# Patient Record
Sex: Male | Born: 1990 | Race: White | Hispanic: No | Marital: Married | Smoking: Current every day smoker
Health system: Southern US, Community
[De-identification: ages and names within clinical notes are randomized; demographics above are authoritative.]

## PROBLEM LIST (undated history)

## (undated) DIAGNOSIS — I10 Essential (primary) hypertension: Secondary | ICD-10-CM

## (undated) HISTORY — PX: VASECTOMY: SHX75

## (undated) HISTORY — PX: LEG SURGERY: SHX1003

## (undated) HISTORY — DX: Essential (primary) hypertension: I10

---

## 2010-02-05 ENCOUNTER — Emergency Department: Payer: Self-pay | Admitting: Unknown Physician Specialty

## 2010-04-29 ENCOUNTER — Emergency Department: Payer: Self-pay | Admitting: Emergency Medicine

## 2010-06-16 ENCOUNTER — Emergency Department: Payer: Self-pay | Admitting: Emergency Medicine

## 2013-04-24 ENCOUNTER — Ambulatory Visit: Payer: Self-pay | Admitting: Internal Medicine

## 2013-04-25 ENCOUNTER — Ambulatory Visit: Payer: Self-pay | Admitting: Oncology

## 2013-04-26 ENCOUNTER — Ambulatory Visit: Payer: Self-pay | Admitting: Oncology

## 2013-04-26 LAB — COMPREHENSIVE METABOLIC PANEL
Albumin: 4.2 g/dL (ref 3.4–5.0)
Alkaline Phosphatase: 97 U/L (ref 50–136)
Anion Gap: 8 (ref 7–16)
Chloride: 101 mmol/L (ref 98–107)
Creatinine: 1 mg/dL (ref 0.60–1.30)
EGFR (African American): >60
Glucose: 96 mg/dL (ref 65–99)
Osmolality: 281 (ref 275–301)
Potassium: 4.4 mmol/L (ref 3.5–5.1)
SGPT (ALT): 33 U/L (ref 12–78)
Sodium: 140 mmol/L (ref 136–145)
Total Protein: 7.7 g/dL (ref 6.4–8.2)

## 2013-04-26 LAB — CBC CANCER CENTER
Basophil #: 0.1 x10 3/mm (ref 0.0–0.1)
HCT: 47.8 % (ref 40.0–52.0)
Lymphocyte #: 2 x10 3/mm (ref 1.0–3.6)
Lymphocyte %: 16.8 %
MCV: 85 fL (ref 80–100)
Monocyte #: 0.9 x10 3/mm (ref 0.2–1.0)
Neutrophil %: 71.3 %
Platelet: 280 x10 3/mm (ref 150–440)
RBC: 5.63 10*6/uL (ref 4.40–5.90)
WBC: 11.9 x10 3/mm — ABNORMAL HIGH (ref 3.8–10.6)

## 2013-04-26 LAB — LACTATE DEHYDROGENASE: LDH: 173 U/L (ref 85–241)

## 2013-05-09 ENCOUNTER — Encounter: Payer: Self-pay | Admitting: General Surgery

## 2013-05-09 ENCOUNTER — Ambulatory Visit (INDEPENDENT_AMBULATORY_CARE_PROVIDER_SITE_OTHER): Payer: No Typology Code available for payment source | Admitting: General Surgery

## 2013-05-09 VITALS — BP 140/80 | HR 78 | Resp 12 | Ht 75.0 in | Wt 237.0 lb

## 2013-05-09 DIAGNOSIS — I889 Nonspecific lymphadenitis, unspecified: Secondary | ICD-10-CM

## 2013-05-09 NOTE — Progress Notes (Signed)
Patient ID: DOMNIQUE VANTINE, male   DOB: 1991/02/20, 22 y.o.   MRN: [id number]  Chief Complaint  Patient presents with  . Other    right axillary mass    HPI EBONY RICKEL is a 22 y.o. male here today for an evaluation of right axillary mass. Patient had an ultrasound and blood work done on 04/24/13. Patient noticed it about three weeks ago. Started with some vague pain, then noted a swelling. He completed a course of antibiotics and says it feels better. HPI  Past Medical History  Diagnosis Date  . Hypertension     Past Surgical History  Procedure Laterality Date  . Leg surgery Right     as an baby    History reviewed. No pertinent family history.  Social History History  Substance Use Topics  . Smoking status: Current Every Day Smoker -- 1.00 packs/day for 4 years  . Smokeless tobacco: Never Used  . Alcohol Use: No    Allergies  Allergen Reactions  . Bee Venom Swelling    No current outpatient prescriptions on file.   No current facility-administered medications for this visit.    Review of Systems Review of Systems  Constitutional: Negative.   Respiratory: Negative.   Cardiovascular: Negative.     Blood pressure 140/80, pulse 78, resp. rate 12, height 6\' 3"  (1.905 m), weight 237 lb (107.502 kg).  Physical Exam Physical Exam  Constitutional: He is oriented to person, place, and time. He appears well-developed and well-nourished.  Eyes: Conjunctivae are normal. No scleral icterus.  Neck: Neck supple. No mass and no thyromegaly present.  Cardiovascular: Normal rate, regular rhythm, normal heart sounds, intact distal pulses and normal pulses.   Pulses:      Dorsalis pedis pulses are 2+ on the right side, and 2+ on the left side.       Posterior tibial pulses are 2+ on the right side, and 2+ on the left side.  Pulmonary/Chest: Breath sounds normal.  Abdominal: Soft. Normal appearance and bowel sounds are normal. There is no hepatomegaly. There is no  tenderness. No hernia.  Lymphadenopathy:    He has no cervical adenopathy.    He has axillary adenopathy.       Right axillary: Pectoral adenopathy: 1 cm thickening laternal part of the axillary anterior.       Left axillary: No pectoral and no lateral adenopathy present. Neurological: He is alert and oriented to person, place, and time.  Skin: Skin is warm and dry.    Data Reviewed Ultrasound reviewed.  Assessment    Resolving lymphadenitis    Plan    Patient to return in one month .       Oluchi Pucci G 05/09/2013, 5:26 PM

## 2013-05-09 NOTE — Patient Instructions (Signed)
Patient to return in one month. 

## 2013-05-23 ENCOUNTER — Ambulatory Visit: Payer: Self-pay | Admitting: Oncology

## 2013-06-12 ENCOUNTER — Ambulatory Visit: Payer: No Typology Code available for payment source | Admitting: General Surgery

## 2013-07-10 ENCOUNTER — Encounter: Payer: Self-pay | Admitting: *Deleted

## 2016-02-08 ENCOUNTER — Emergency Department
Admission: EM | Admit: 2016-02-08 | Discharge: 2016-02-08 | Disposition: A | Discharge status: Home / Self Care | Payer: 59 | Attending: Emergency Medicine | Admitting: Emergency Medicine

## 2016-02-08 ENCOUNTER — Emergency Department: Payer: 59

## 2016-02-08 ENCOUNTER — Encounter: Payer: Self-pay | Admitting: Emergency Medicine

## 2016-02-08 DIAGNOSIS — Y92410 Unspecified street and highway as the place of occurrence of the external cause: Secondary | ICD-10-CM | POA: Diagnosis not present

## 2016-02-08 DIAGNOSIS — Y999 Unspecified external cause status: Secondary | ICD-10-CM | POA: Diagnosis not present

## 2016-02-08 DIAGNOSIS — S8002XA Contusion of left knee, initial encounter: Secondary | ICD-10-CM

## 2016-02-08 DIAGNOSIS — F17200 Nicotine dependence, unspecified, uncomplicated: Secondary | ICD-10-CM | POA: Insufficient documentation

## 2016-02-08 DIAGNOSIS — Y9355 Activity, bike riding: Secondary | ICD-10-CM | POA: Insufficient documentation

## 2016-02-08 DIAGNOSIS — I10 Essential (primary) hypertension: Secondary | ICD-10-CM | POA: Diagnosis not present

## 2016-02-08 DIAGNOSIS — M25562 Pain in left knee: Secondary | ICD-10-CM | POA: Diagnosis present

## 2016-02-08 MED ORDER — OXYCODONE-ACETAMINOPHEN 5-325 MG PO TABS: 1.0000 | ORAL_TABLET | Freq: Four times a day (QID) | ORAL | Status: DC | PRN

## 2016-02-08 MED ORDER — OXYCODONE-ACETAMINOPHEN 5-325 MG PO TABS
ORAL_TABLET | ORAL | Status: AC
  Administered 2016-02-08: 1 via ORAL
  Filled 2016-02-08: qty 1

## 2016-02-08 MED ORDER — MELOXICAM 15 MG PO TABS: 15.0000 mg | ORAL_TABLET | Freq: Every day | ORAL | Status: DC

## 2016-02-08 MED ORDER — OXYCODONE-ACETAMINOPHEN 5-325 MG PO TABS
1.0000 | ORAL_TABLET | ORAL | Status: DC | PRN
  Administered 2016-02-08: 1 via ORAL

## 2016-02-08 NOTE — Discharge Instructions (Signed)
Periosteal Hematoma Periosteal hematoma (bone bruise) is a localized, tender, raised area close to the bone. It can occur from a small hidden fracture of the bone, following surgery, or from other trauma to the area. It typically occurs in bones located close to the surface of the skin, such as the shin, knee, and heel bone. Although it may take 2 or more weeks to completely heal, bone bruises typically are not associated with permanent or serious damage to the bone. If you are taking blood thinners, you may be at greater risk for such injuries.  CAUSES  A bone bruise is usually caused by high-impact trauma to the bone, but it can be caused by sports injuries or twisting injuries. SIGNS AND SYMPTOMS   Severe pain around the injured area that typically lasts longer than a normal bruise.  Difficulty using the bruised area.  Tender, raised area close to the bone.  Discoloration or swelling of the bruised area. DIAGNOSIS  You may need an MRI of the injured area to confirm a bone bruise if your health care provider feels it is necessary. A regular X-ray will not detect a bone bruise, but it will detect a broken bone (fracture). An X-ray may be taken to rule out any fractures. TREATMENT  Often, the best treatment for a bone bruise is resting, icing, and applying cold compresses to the injured area. Over-the-counter medicines may also be recommended for pain control. HOME CARE INSTRUCTIONS  Some things you can do to improve the condition are:   Rest and elevate the area of injury as long as it is very tender or swollen.  Apply ice to the injured area:  Put ice in a plastic bag.  Place a towel between your skin and the bag.  Leave the ice on for 20 minutes, 2-3 times a day.  Use an elastic wrap to reduce swelling and protect the injured area. Make sure it is not applied too tightly. If the area around the wrap becomes cold or blue, the wrap is too tight. Wrap it more loosely.  For  activity:  Follow your health care provider's instructions about whether walking with crutches is required. This will depend on how serious your condition is.  Start weight bearing gradually on the bruised part.  Continue to use crutches or a cane until you can stand without causing pain, or as instructed.  If a plaster splint was applied:  Wear the splint until you are seen for a follow-up exam.  Rest it on nothing harder than a pillow the first 24 hours.  Do not put weight on it.  Do not get it wet. You may take it off to take a shower or bath.  You may have been given an elastic bandage to use with or without the plaster splint. The splint is too tight if you have numbness or tingling, or if the skin around the bandage becomes cold and blue. Adjust the bandage to make it comfortable.  If an air splint was applied:  You may alter the amount of air in the splint as needed for comfort.  You may take it off at night and to take a shower or bath.  If the injury was in either leg, wiggle your toes in the splint several times per day if you are able.  Only take over-the-counter or prescription medicines for pain, discomfort, or fever as directed by your health care provider.  Keep all follow-up visits with your health care provider. This includes   any orthopedic referrals, physical therapy, and rehabilitation. Any delay in getting necessary care could result in a delay or failure of the bones to heal. SEEK MEDICAL CARE IF:   You have an increase in bruising, swelling, tenderness, heat, or pain over your injury.  You notice coldness of your toes that does not improve after removing a splint or bandage.  Your pain is not lessened after you take medicine.  You have increased difficulty bearing weight on the injured leg, if the injury is in either leg. SEEK IMMEDIATE MEDICAL CARE IF:   You have severe pain near the injured area or severe pain with stretching.  You have increased  swelling that resulted in a tense, hard area or a loss of sensation in the area of the injury.  You have pale, cool skin below the area of the injury (in an extremity) that does not go away after removing a splint or bandage. MAKE SURE YOU:   Understand these instructions.  Will watch your condition.  Will get help right away if you are not doing well or get worse.   This information is not intended to replace advice given to you by your health care provider. Make sure you discuss any questions you have with your health care provider.   Document Released: 09/16/2004 Document Revised: 05/30/2013 Document Reviewed: 01/26/2013 Elsevier Interactive Patient Education 2016 [employer]  

## 2016-02-08 NOTE — ED Notes (Signed)
Patient has bilateral equal pulse qualities and no decreased tactile sensation on left foot.
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
## 2020-01-25 ENCOUNTER — Emergency Department
Admission: EM | Admit: 2020-01-25 | Discharge: 2020-01-25 | Disposition: A | Discharge status: Home / Self Care | Payer: BC Managed Care – PPO | Attending: Emergency Medicine | Admitting: Emergency Medicine

## 2020-01-25 ENCOUNTER — Encounter: Payer: Self-pay | Admitting: Emergency Medicine

## 2020-01-25 ENCOUNTER — Other Ambulatory Visit: Payer: Self-pay

## 2020-01-25 DIAGNOSIS — K643 Fourth degree hemorrhoids: Secondary | ICD-10-CM | POA: Insufficient documentation

## 2020-01-25 DIAGNOSIS — F17210 Nicotine dependence, cigarettes, uncomplicated: Secondary | ICD-10-CM | POA: Diagnosis not present

## 2020-01-25 DIAGNOSIS — I10 Essential (primary) hypertension: Secondary | ICD-10-CM | POA: Diagnosis not present

## 2020-01-25 DIAGNOSIS — K625 Hemorrhage of anus and rectum: Secondary | ICD-10-CM | POA: Diagnosis present

## 2020-01-25 MED ORDER — ONDANSETRON 4 MG PO TBDP: 4.0000 mg | ORAL_TABLET | Freq: Three times a day (TID) | ORAL | 0 refills | Status: AC | PRN

## 2020-01-25 MED ORDER — DIBUCAINE 1 % EX OINT: TOPICAL_OINTMENT | Freq: Three times a day (TID) | CUTANEOUS | 0 refills | Status: DC | PRN

## 2020-01-25 MED ORDER — OXYCODONE-ACETAMINOPHEN 5-325 MG PO TABS
1.0000 | ORAL_TABLET | Freq: Once | ORAL | Status: AC
  Administered 2020-01-25: 1 via ORAL
  Filled 2020-01-25: qty 1

## 2020-01-25 MED ORDER — OXYCODONE-ACETAMINOPHEN 5-325 MG PO TABS: 1.0000 | ORAL_TABLET | Freq: Four times a day (QID) | ORAL | 0 refills | Status: AC | PRN

## 2020-01-25 MED ORDER — HYDROCORTISONE ACETATE 25 MG RE SUPP: 25.0000 mg | Freq: Two times a day (BID) | RECTAL | 1 refills | Status: AC

## 2020-01-25 MED ORDER — ONDANSETRON 4 MG PO TBDP
4.0000 mg | ORAL_TABLET | Freq: Once | ORAL | Status: AC
  Administered 2020-01-25: 4 mg via ORAL
  Filled 2020-01-25: qty 1

## 2020-01-25 NOTE — ED Triage Notes (Signed)
Pt to ER states known hemorrhoids and that after a BM today noted "a lot of blood" on the wet wipe.  Pt states also noted "old clots".  Pt denies blood in toilet.  Pt unable to sit in triage related to rectal pain.

## 2020-01-25 NOTE — ED Notes (Signed)
Pt from home with hemorrhoid x 3 days. Pt states he lifts a lot at work and had bright red blood with 2 clots when he wiped this morning. Pt alert & oriented, nad noted.

## 2020-01-25 NOTE — ED Provider Notes (Signed)
Emergency Department Provider Note  ____________________________________________  Time seen: Approximately 6:43 PM  I have reviewed the triage vital signs and the nursing notes.   HISTORY  Chief Complaint Hemorrhoids and Rectal Bleeding   Historian Patient    HPI Jon Holden is a 29 y.o. male presents to the emergency department with rectal bleeding and perirectal pain for the past 3 days.  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  Patient's wife has noticed hemorrhoid and has been applying Preparation H.  Wife states that before the bleeding started, she noticed purple area to hemorrhoid and became concerned.  Hemorrhoid is nonreducible.  No prior history of hemorrhoids.  No fever or chills at home.  Bleeding only occurs when patient is wiping.   Past Medical History:  Diagnosis Date  . Hypertension      Immunizations up to date:  Yes.     Past Medical History:  Diagnosis Date  . Hypertension     Patient Active Problem List   Diagnosis Date Noted  . Lymphadenitis 05/09/2013    History reviewed. No pertinent surgical history.  Prior to Admission medications   Medication Sig Start Date End Date Taking? Authorizing Provider  dibucaine (NUPERCAINAL) 1 % ointment Apply topically 3 (three) times daily as needed for pain. 01/25/20   Lannie Fields, PA-C  hydrocortisone (ANUSOL-HC) 25 MG suppository Place 1 suppository (25 mg total) rectally every 12 (twelve) hours. 01/25/20 01/24/21  Lannie Fields, PA-C  meloxicam (MOBIC) 15 MG tablet Take 1 tablet (15 mg total) by mouth daily. 02/08/16   Cuthriell, Charline Bills, PA-C  ondansetron (ZOFRAN ODT) 4 MG disintegrating tablet Take 1 tablet (4 mg total) by mouth every 8 (eight) hours as needed for up to 5 days for nausea or vomiting. 01/25/20 01/30/20  Lannie Fields, PA-C  oxyCODONE-acetaminophen (PERCOCET/ROXICET) 5-325 MG tablet Take 1 tablet by mouth every 6 (six) hours as  needed for up to 3 days. 01/25/20 01/28/20  Lannie Fields, PA-C    Allergies Bee venom  History reviewed. No pertinent family history.  Social History Social History   Tobacco Use  . Smoking status: Current Every Day Smoker    Packs/day: 1.00    Years: 4.00    Pack years: 4.00  . Smokeless tobacco: Never Used  Substance Use Topics  . Alcohol use: No  . Drug use: No     Review of Systems  Constitutional: No fever/chills Eyes:  No discharge ENT: No upper respiratory complaints. Respiratory: no cough. No SOB/ use of accessory muscles to breath Gastrointestinal: Patient has perirectal pain and bleeding. Musculoskeletal: Negative for musculoskeletal pain. Skin: Negative for rash, abrasions, lacerations, ecchymosis.    ____________________________________________   PHYSICAL EXAM:  VITAL SIGNS: ED Triage Vitals  Enc Vitals Group     BP 01/25/20 1705 131/82     Pulse Rate 01/25/20 1705 (!) 104     Resp 01/25/20 1705 18     Temp 01/25/20 1705 98.7 F (37.1 C)     Temp Source 01/25/20 1705 Oral     SpO2 01/25/20 1705 97 %     Weight 01/25/20 1706 230 lb (104.3 kg)     Height 01/25/20 1706 6\' 3"  (1.905 m)     Head Circumference --      Peak Flow --      Pain Score 01/25/20 1709 3     Pain Loc --  Pain Edu? --      Excl. in GC? --      Constitutional: Alert and oriented. Well appearing and in no acute distress. Eyes: Conjunctivae are normal. PERRL. EOMI. Head: Atraumatic. Cardiovascular: Normal rate, regular rhythm. Normal S1 and S2.  Good peripheral circulation. Respiratory: Normal respiratory effort without tachypnea or retractions. Lungs CTAB. Good air entry to the bases with no decreased or absent breath sounds Gastrointestinal: Bowel sounds x 4 quadrants. Soft and nontender to palpation. No guarding or rigidity. No distention.  Patient has stage IV nonthrombosed hemorrhoid. Musculoskeletal: Full range of motion to all extremities. No obvious deformities  noted Neurologic:  Normal for age. No gross focal neurologic deficits are appreciated.  Skin:  Skin is warm, dry and intact. No rash noted. Psychiatric: Mood and affect are normal for age. Speech and behavior are normal.   ____________________________________________   LABS (all labs ordered are listed, but only abnormal results are displayed)  Labs Reviewed - No data to display ____________________________________________  EKG   ____________________________________________  RADIOLOGY   No results found.  ____________________________________________    PROCEDURES  Procedure(s) performed:     Procedures     Medications  oxyCODONE-acetaminophen (PERCOCET/ROXICET) 5-325 MG per tablet 1 tablet (has no administration in time range)  ondansetron (ZOFRAN-ODT) disintegrating tablet 4 mg (has no administration in time range)     ____________________________________________   INITIAL IMPRESSION / ASSESSMENT AND PLAN / ED COURSE  Pertinent labs & imaging results that were available during my care of the patient were reviewed by me and considered in my medical decision making (see chart for details).      Assessment and plan Hemorrhoids 29 year old male presents to the emergency department with concern for hemorrhoids.  On exam, patient had a stage IV nonthrombosed hemorrhoid.  Given description during history, I suspect that patient had spontaneous rupture of a nonthrombosed hemorrhoid.  Bleeding was scant on exam.  We will treat patient with Anusol, dibucaine and Percocet.  Patient was cautioned to use a stool softener and to avoid straining.  Return precautions were given to return with new or worsening symptoms.  Referral to general surgery was given if symptoms do not improve on their own.   ____________________________________________  FINAL CLINICAL IMPRESSION(S) / ED DIAGNOSES  Final diagnoses:  Grade IV hemorrhoids      NEW MEDICATIONS STARTED DURING  THIS VISIT:  ED Discharge Orders         Ordered    oxyCODONE-acetaminophen (PERCOCET/ROXICET) 5-325 MG tablet  Every 6 hours PRN     01/25/20 1839    ondansetron (ZOFRAN ODT) 4 MG disintegrating tablet  Every 8 hours PRN     01/25/20 1839    dibucaine (NUPERCAINAL) 1 % ointment  3 times daily PRN     01/25/20 1841    hydrocortisone (ANUSOL-HC) 25 MG suppository  Every 12 hours     01/25/20 1841              This chart was dictated using voice recognition software/Dragon. Despite best efforts to proofread, errors can occur which can change the meaning. Any change was purely unintentional.     Orvil Feil, PA-C 01/25/20 1847    Phineas Semen, MD 01/25/20 Ebony Cargo

## 2020-01-25 NOTE — ED Notes (Signed)
Pt discharged home after verbalizing understanding of discharge instructions; nad noted. 

## 2021-02-03 ENCOUNTER — Ambulatory Visit (INDEPENDENT_AMBULATORY_CARE_PROVIDER_SITE_OTHER): Payer: Managed Care, Other (non HMO) | Admitting: Urology

## 2021-02-03 ENCOUNTER — Encounter: Payer: Self-pay | Admitting: Urology

## 2021-02-03 ENCOUNTER — Other Ambulatory Visit: Payer: Self-pay

## 2021-02-03 VITALS — BP 119/74 | HR 0 | Ht 75.0 in | Wt 235.0 lb

## 2021-02-03 DIAGNOSIS — Z3009 Encounter for other general counseling and advice on contraception: Secondary | ICD-10-CM | POA: Diagnosis not present

## 2021-02-03 MED ORDER — DIAZEPAM 10 MG PO TABS: 10.0000 mg | ORAL_TABLET | Freq: Once | ORAL | 0 refills | Status: AC

## 2021-02-03 NOTE — Patient Instructions (Signed)

## 2021-02-03 NOTE — Progress Notes (Signed)
02/03/2021 3:52 PM   Jon Holden 07-31-1991 [id number]  Referring provider: No referring provider defined for this encounter.  Chief Complaint  Patient presents with   VAS Consult    HPI: 30 y.o. year old male referred for further evaluation of possible vasectomy.  He denies a history of testicular trauma or pain.  No urinary issues.  No previous scrotal surgeries.  He has 4 biological children and desires no more.     PMH: Past Medical History:  Diagnosis Date   Hypertension     Surgical History: History reviewed. No pertinent surgical history.  Home Medications:  Allergies as of 02/03/2021       Reactions   Bee Venom Swelling        Medication List        Accurate as of February 03, 2021  3:52 PM. If you have any questions, ask your nurse or doctor.          dibucaine 1 % ointment Commonly known as: NUPERCAINAL Apply topically 3 (three) times daily as needed for pain.   meloxicam 15 MG tablet Commonly known as: MOBIC Take 1 tablet (15 mg total) by mouth daily.        Allergies:  Allergies  Allergen Reactions   Bee Venom Swelling    Family History: History reviewed. No pertinent family history.  Social History:  reports that he has been smoking. He has a 4.00 pack-year smoking history. He has never used smokeless tobacco. He reports that he does not drink alcohol and does not use drugs.   Physical Exam: BP 119/74   Pulse (!) 0   Ht 6\' 3"  (1.905 m)   Wt 235 lb (106.6 kg)   BMI 29.37 kg/m   Constitutional:  Alert and oriented, No acute distress. HEENT: [place] AT, moist mucus membranes.  Trachea midline, no masses. Cardiovascular: No clubbing, cyanosis, or edema. Respiratory: Normal respiratory effort, no increased work of breathing. GI: Abdomen is soft, nontender, nondistended, no abdominal masses GU: Normal phallus.  Bilateral descended testicles without masses.  Vasa easily palpable bilaterally. Skin: No rashes, bruises or  suspicious lesions. Neurologic: Grossly intact, no focal deficits, moving all 4 extremities. Psychiatric: Normal mood and affect.   Assessment & Plan:    1. Vasectomy evaluation Today, we discussed what the vas deferens is, where it is located, and its function. We reviewed the procedure for vasectomy, it's risks, benefits, alternatives, and likelihood of achieving his goals. We discussed in detail the procedure, complications, and recovery as well as the need for clearance prior to unprotected intercourse. We discussed that vasectomy does not protect against sexually transmitted diseases. We discussed that this procedure does not result in immediate sterility and that they would need to use other forms of birth control until he has been cleared with negative postvasectomy semen analyses. I explained that the procedure is considered to be permanent and that attempts at reversal have varying degrees of success. These options include vasectomy reversal, sperm retrieval, and in vitro fertilization; these can be very expensive. We discussed the chance of postvasectomy pain syndrome which occurs in less than 5% of patients. I explained to the patient that there is no treatment to resolve this chronic pain, and that if it developed I would not be able to help resolve the issue, but that surgery is generally not needed for correction. I explained there have even been reports of systemic like illness associated with this chronic pain, and that there was no good  cure. I explained that vasectomy it is not a 100% reliable form of birth control, and the risk of pregnancy after vasectomy is approximately 1 in 2000 men who had a negative postvasectomy semen analysis or rare non-motile sperm. I explained that repeat vasectomy was necessary in less than 1% of vasectomy procedures when employing the type of technique that I use. I explained that he should refrain from ejaculation for approximately one week following  vasectomy. I explained that there are other options for birth control which are permanent and non-permanent; we discussed these. I explained the rates of surgical complications, such as symptomatic hematoma or infection, are low (1-2%) and vary with the surgeon's experience and criteria used to diagnose the complication.   The patient had the opportunity to ask questions to his stated satisfaction. He voiced understanding of the above factors and stated that he has read all the information provided to him and the packets and informed consent.  He is interested in receiving of Valium 10 mg prior to the procedure for the purpose of anxiolysis.  A prescription was given today.  He will have a driver on the day of the procedure.    Vanna Scotland, MD  [facility] [address], [address] [place], [place] [address] [phone]

## 2021-04-29 NOTE — Progress Notes (Incomplete)
04/30/2021  CC: No chief complaint on file.   HPI: Jon Holden is a 30 y.o. male who presents today for vasectomy.   He has no history of testicular trauma or pain. No urinary issues and no previous scrotal surgeries.      There were no vitals filed for this visit. NED. A&Ox3.   No respiratory distress   Abd soft, NT, ND Normal external genitalia with patent urethral meatus   Bilateral Vasectomy Procedure  Pre-Procedure: - Patient's scrotum was prepped and draped for vasectomy. - The vas was palpated through the scrotal skin on the left. - 1% Xylocaine was injected into the skin and surrounding tissue for placement  - In a similar manner, the vas on the right was identified, anesthetized, and stabilized.  Procedure: - A #11 blade was used to make a small stab incision in the skin overlying the vas - The left vas was isolated and brought up through the incision exposing that structure. - Bleeding points were cauterized as they occurred. - The vas was free from the surrounding structures and brought to the view. - A segment was positioned for placement with a hemostat. - A second hemostat was placed and a small segment between the two hemostats and was removed for inspection. - Each end of the transected vas lumen was fulgurated/ obliterated using needlepoint electrocautery -A fascial interposition was performed on testicular end of the vas using #3-0 chromic suture -The same procedure was performed on the right. - A single suture of #3-0 chromic catgut was used to close each lateral scrotal skin incision - A dressing was applied.  Post-Procedure: - Patient was instructed in care of the operative area - A specimen is to be delivered in 12 weeks   -Another form of contraception is to be used until post vasectomy semen analysis  I,Kailey Littlejohn,acting as a scribe for Vanna Scotland, MD.,have documented all relevant documentation on the behalf of Vanna Scotland,  MD,as directed by  Vanna Scotland, MD while in the presence of Vanna Scotland, MD.

## 2021-04-30 ENCOUNTER — Encounter: Payer: Self-pay | Admitting: Urology

## 2021-05-01 ENCOUNTER — Encounter: Payer: Self-pay | Admitting: Urology

## 2022-12-22 ENCOUNTER — Other Ambulatory Visit: Payer: Self-pay

## 2022-12-22 ENCOUNTER — Encounter: Payer: Self-pay | Admitting: Urology

## 2022-12-22 ENCOUNTER — Ambulatory Visit (INDEPENDENT_AMBULATORY_CARE_PROVIDER_SITE_OTHER): Payer: Managed Care, Other (non HMO) | Admitting: Urology

## 2022-12-22 VITALS — BP 138/75 | HR 93 | Ht 75.0 in | Wt 245.0 lb

## 2022-12-22 DIAGNOSIS — Z3009 Encounter for other general counseling and advice on contraception: Secondary | ICD-10-CM | POA: Diagnosis not present

## 2022-12-22 NOTE — Telephone Encounter (Signed)
Patient called in asking for Valium to be sent prior to Vasectomy.

## 2022-12-22 NOTE — Progress Notes (Signed)
   I, Duke Salvia, acting as a Neurosurgeon for Riki Altes, MD., have documented all relevant documentation on the behalf of Riki Altes, MD, as directed by  Riki Altes, MD while in the presence of Riki Altes, MD.  12/22/2022 4:34 PM   Jon Holden [birth year] [id number]   Chief Complaint  Patient presents with   VAS Consult    HPI: 32 y.o. year old male presenting for vasectomy counseling.  Initially saw Dr. Apolinar Junes in June 2022 for a vasectomy consultation. He was scheduled for the vasectomy, however, did not have insurance at the time and was unable to have it done.  Since it has been over a year since his last visit, a follow up appointment was scheduled. Doing well since last visit. No bothersome LUTS. Denies dysuria, gross hematuria. Denies flank, abdominal or pelvic pain.    PMH: Past Medical History:  Diagnosis Date   Hypertension     Home Medications:  Allergies as of 12/22/2022       Reactions   Bee Venom Swelling        Medication List        Accurate as of Dec 22, 2022  4:34 PM. If you have any questions, ask your nurse or doctor.          dibucaine 1 % ointment Commonly known as: NUPERCAINAL Apply topically 3 (three) times daily as needed for pain.   meloxicam 15 MG tablet Commonly known as: MOBIC Take 1 tablet (15 mg total) by mouth daily.        Allergies:  Allergies  Allergen Reactions   Bee Venom Swelling    Social History:  reports that he has been smoking. He has a 4.00 pack-year smoking history. He has never used smokeless tobacco. He reports that he does not drink alcohol and does not use drugs.   Physical Exam: BP 138/75   Pulse 93   Ht 6\' 3"  (1.905 m)   Wt 245 lb (111.1 kg)   BMI 30.62 kg/m   Constitutional:  Alert and oriented, No acute distress. HEENT: [place] AT, moist mucus membranes.  Trachea midline, no masses. Cardiovascular: No clubbing, cyanosis, or edema. Respiratory: Normal respiratory  effort, no increased work of breathing. GI: Abdomen is soft, nontender, nondistended, no abdominal masses GU: Phallus without lesions. Testes descended bilaterally without masses or tenderness. Vasa palpable bilaterally. Skin: No rashes, bruises or suspicious lesions. Neurologic: Grossly intact, no focal deficits, moving all 4 extremities. Psychiatric: Normal mood and affect.   Assessment & Plan:    1. Undesired [facility] [address], [address] [place], [place] [address] [phone]

## 2022-12-22 NOTE — Patient Instructions (Signed)

## 2022-12-23 ENCOUNTER — Encounter: Payer: Self-pay | Admitting: Urology

## 2022-12-23 MED ORDER — DIAZEPAM 10 MG PO TABS: ORAL_TABLET | ORAL | 0 refills | Status: AC

## 2023-01-14 ENCOUNTER — Encounter: Payer: Self-pay | Admitting: Urology

## 2023-01-14 ENCOUNTER — Ambulatory Visit: Payer: Managed Care, Other (non HMO) | Admitting: Urology

## 2023-01-14 VITALS — BP 149/84 | HR 102 | Ht 75.0 in | Wt 245.0 lb

## 2023-01-14 DIAGNOSIS — Z302 Encounter for sterilization: Secondary | ICD-10-CM

## 2023-01-14 MED ORDER — HYDROCODONE-ACETAMINOPHEN 5-325 MG PO TABS: 1.0000 | ORAL_TABLET | Freq: Four times a day (QID) | ORAL | 0 refills | Status: AC | PRN

## 2023-01-14 NOTE — Progress Notes (Unsigned)
   Vasectomy Procedure Note  Indications: Jon Holden is a 32 y.o. male who presents today for elective sterilization.  He has been consented for the procedure.  He is aware of the risks and benefits.  He had no additional questions.  He agrees to proceed.  He denies any other significant change since his last visit.  Pre-operative Diagnosis: Elective sterilization  Post-operative Diagnosis: Elective sterilization  Premedication: Valium 10 mg po  Surgeon: Lorin Picket C. Pandora Mccrackin, M.D  Description: The patient was prepped and draped in the standard fashion.  The right vas deferens was identified and brought superiorly to the anterior scrotal skin.  The skin and vas were then anesthetized utilizing 8 ml 1% lidocaine.  A small stab incision was made and spread with the vas dissector.  The vas was grasped utilizing the vas clamp and elevated out of the incision.  The vas was dissected free from surrounding tissue and vessels and an ~1 cm segment was excised.  The vas lumens were cauterized utilizing electrocautery.  The distal segment was buried in the surrounding sheath with a 3-0 chromic suture.  No significant bleeding was observed.  The vas ends were then dropped back into the hemiscrotum.  The skin was closed with hemostatic pressure.  An identical procedure was performed on the contralateral side.  Clean dry gauze was applied to the incision sites.  The patient tolerated the procedure well.  Complications:None  Recommendations: 1.  No lifting greater than 10 pounds or strenuous activity for 1 week. 2.  Scrotal support for 1-2 weeks. 3.  May shower in 24 hours; no bath, hot tub for 1 week 4.  No intercourse for at least 7 days and resume based on level of discomfort  5.  Continue alternate contraception for 12 weeks.  6.  Call for significant pain, swelling, redness, drainage or fever greater than 100.5. 7.  Rx hydrocodone/APAP 5/325 1-2 every 6 hours prn pain. 8.  Follow-up semen  analysis in 12 weeks.   Irineo Axon, MD

## 2023-01-14 NOTE — Patient Instructions (Signed)

## 2023-01-17 ENCOUNTER — Encounter: Payer: Self-pay | Admitting: Urology

## 2023-01-20 ENCOUNTER — Encounter: Payer: Self-pay | Admitting: Urology

## 2023-04-22 ENCOUNTER — Other Ambulatory Visit: Payer: Managed Care, Other (non HMO)

## 2023-04-22 DIAGNOSIS — Z302 Encounter for sterilization: Secondary | ICD-10-CM

## 2023-04-23 LAB — POST-VAS SPERM EVALUATION,QUAL: Volume: 2 mL

## 2024-02-09 ENCOUNTER — Encounter: Payer: Self-pay | Admitting: Dermatology

## 2024-02-09 ENCOUNTER — Ambulatory Visit: Payer: Self-pay | Admitting: Dermatology

## 2024-02-09 DIAGNOSIS — B360 Pityriasis versicolor: Secondary | ICD-10-CM | POA: Diagnosis not present

## 2024-02-09 DIAGNOSIS — L700 Acne vulgaris: Secondary | ICD-10-CM

## 2024-02-09 DIAGNOSIS — L720 Epidermal cyst: Secondary | ICD-10-CM | POA: Diagnosis not present

## 2024-02-09 DIAGNOSIS — Z7189 Other specified counseling: Secondary | ICD-10-CM

## 2024-02-09 DIAGNOSIS — Z79899 Other long term (current) drug therapy: Secondary | ICD-10-CM

## 2024-02-09 MED ORDER — KETOCONAZOLE 2 % EX SHAM: 1.0000 | MEDICATED_SHAMPOO | CUTANEOUS | 11 refills | Status: AC

## 2024-02-09 MED ORDER — ISOTRETINOIN 20 MG PO CAPS: 20.0000 mg | ORAL_CAPSULE | Freq: Every day | ORAL | 0 refills | Status: AC

## 2024-02-09 NOTE — Patient Instructions (Addendum)
 Tinea versicolor is a chronic recurrent skin rash causing discolored scaly spots most commonly seen on back, chest, and/or shoulders.  It is generally asymptomatic. The rash is due to overgrowth of a common type of yeast present on everyone's skin and it is not contagious.  It tends to flare more in the summer due to increased sweating on trunk.  After rash is treated, the scaliness will resolve, but the discoloration will take longer to return to normal pigmentation. The periodic use of an OTC medicated soap/shampoo with zinc or selenium sulfide can be helpful to prevent yeast overgrowth and recurrence.  Recommend mineral sunscreens with Zinc or Titanium    Due to recent changes in healthcare laws, you may see results of your pathology and/or laboratory studies on MyChart before the doctors have had a chance to review them. We understand that in some cases there may be results that are confusing or concerning to you. Please understand that not all results are received at the same time and often the doctors may need to interpret multiple results in order to provide you with the best plan of care or course of treatment. Therefore, we ask that you please give us  2 business days to thoroughly review all your results before contacting the office for clarification. Should we see a critical lab result, you will be contacted sooner.   If You Need Anything After Your Visit  If you have any questions or concerns for your doctor, please call our main line at [phone] and press option 4 to reach your doctor's medical assistant. If no one answers, please leave a voicemail as directed and we will return your call as soon as possible. Messages left after 4 pm will be answered the following business day.   You may also send us  a message via MyChart. We typically respond to MyChart messages within 1-2 business days.  For prescription refills, please ask your pharmacy to contact our office. Our fax number is  [phone].  If you have an urgent issue when the clinic is closed that cannot wait until the next business day, you can page your doctor at the number below.    Please note that while we do our best to be available for urgent issues outside of office hours, we are not available 24/7.   If you have an urgent issue and are unable to reach us , you may choose to seek medical care at your doctor's office, retail clinic, urgent care center, or emergency room.  If you have a medical emergency, please immediately call 911 or go to the emergency department.  Pager Numbers  - Dr. Bary Likes: [phone]  - Dr. Annette Barters: [phone]  - Dr. Felipe Horton: [phone]   In the event of inclement weather, please call our main line at [phone] for an update on the status of any delays or closures.  Dermatology Medication Tips: Please keep the boxes that topical medications come in in order to help keep track of the instructions about where and how to use these. Pharmacies typically print the medication instructions only on the boxes and not directly on the medication tubes.   If your medication is too expensive, please contact our office at [phone] option 4 or send us  a message through MyChart.   We are unable to tell what your co-pay for medications will be in advance as this is different depending on your insurance coverage. However, we may be able to find a substitute medication at lower cost or fill out paperwork  to get insurance to cover a needed medication.   If a prior authorization is required to get your medication covered by your insurance company, please allow us  1-2 business days to complete this process.  Drug prices often vary depending on where the prescription is filled and some pharmacies may offer cheaper prices.  The website www.goodrx.com contains coupons for medications through different pharmacies. The prices here do not account for what the cost may be with help from  insurance (it may be cheaper with your insurance), but the website can give you the price if you did not use any insurance.  - You can print the associated coupon and take it with your prescription to the pharmacy.  - You may also stop by our office during regular business hours and pick up a GoodRx coupon card.  - If you need your prescription sent electronically to a different pharmacy, notify our office through [facility] or by phone at [phone] option 4.     Si Usted Necesita Algo Despus de Su Visita  Tambin puede enviarnos un mensaje a travs de Clinical cytogeneticist. Por lo general respondemos a los mensajes de MyChart en el transcurso de 1 a 2 das hbiles.  Para renovar recetas, por favor pida a su farmacia que se ponga en contacto con nuestra oficina. Franz Jacks de fax es [place] [phone].  Si tiene un asunto urgente cuando la clnica est cerrada y que no puede esperar hasta el siguiente da hbil, puede llamar/localizar a su doctor(a) al nmero que aparece a continuacin.   Por favor, tenga en cuenta que aunque hacemos todo lo posible para estar disponibles para asuntos urgentes fuera del horario de [place], no estamos disponibles las 24 horas del da, los 7 [address]  [address] de la [place].   Si tiene un problema urgente y no puede comunicarse con nosotros, puede optar por buscar atencin mdica  en el consultorio de su doctor(a), en una clnica privada, en un centro de atencin urgente o en una sala de emergencias.  Si tiene Engineer, drilling, por favor llame inmediatamente al 911 o vaya a la sala de emergencias.  Nmeros de bper  - Dr. Bary Likes: [phone]  - Dra. Annette Barters: [phone]  - Dr. Felipe Horton: [phone]   En caso de inclemencias del tiempo, por favor llame a Lajuan Pila principal al [phone] para una actualizacin sobre el [place] de cualquier retraso o cierre.  Consejos para la medicacin en dermatologa: Por favor, guarde las cajas en las que vienen los  medicamentos de uso tpico para ayudarle a seguir las instrucciones sobre dnde y cmo usarlos. Las farmacias generalmente imprimen las instrucciones del medicamento slo en las cajas y no directamente en los tubos del [place].   Si su medicamento es muy caro, por favor, pngase en contacto con Bettyjane Brunet llamando al [phone] y presione la opcin 4 o envenos un mensaje a travs de Clinical cytogeneticist.   No podemos decirle cul ser su copago por los medicamentos por adelantado ya que esto es diferente dependiendo de la cobertura de su seguro. Sin embargo, es posible que podamos encontrar un medicamento sustituto a Audiological scientist un formulario para que el seguro cubra el medicamento que se considera necesario.   Si se requiere una autorizacin previa para que su compaa de seguros [place] su medicamento, por favor permtanos de 1 a 2 das hbiles para completar este proceso.  Los precios de los medicamentos varan con frecuencia dependiendo del Environmental consultant de dnde se surte la receta y [place]  farmacias pueden ofrecer precios ms baratos.  El sitio web www.goodrx.com tiene cupones para medicamentos de Health and safety inspector. Los precios aqu no tienen en cuenta lo que podra costar con la ayuda del seguro (puede ser ms barato con su seguro), pero el sitio web puede darle el precio si no utiliz Tourist information centre manager.  - Puede imprimir el cupn correspondiente y llevarlo con su receta a la farmacia.  - Tambin puede pasar por nuestra oficina durante el horario de atencin regular y Education officer, museum una tarjeta de cupones de GoodRx.  - Si necesita que su receta se enve electrnicamente a una farmacia diferente, informe a nuestra oficina a travs de MyChart de [place] o por telfono llamando al [phone] y presione la opcin 4.

## 2024-02-09 NOTE — Progress Notes (Unsigned)
 Follow-Up Visit   Subjective  Jon Holden is a 33 y.o. male who presents for the following: rash arms, 20m trunk, worse when he gets hot, itchy when hot, pt states he had in past txted by Dr. Bary Likes, growth back ~20yrs, may have grown a little, no symptoms, Acne face and back, years, pt treated with Accutane ~68m course in past The patient has spots, moles and lesions to be evaluated, some may be new or changing and the patient may have concern these could be cancer.   The following portions of the chart were reviewed this encounter and updated as appropriate: medications, allergies, medical history  Review of Systems:  No other skin or systemic complaints except as noted in HPI or Assessment and Plan.  Objective  Well appearing patient in no apparent distress; mood and affect are within normal limits.   A focused examination was performed of the following areas: Trunk, arms, neck, face  Relevant exam findings are noted in the Assessment and Plan.    Assessment & Plan   EPIDERMAL INCLUSION CYST L upper back Exam: Subcutaneous nodule at upper back  Benign-appearing. Exam most consistent with an epidermal inclusion cyst. Discussed that a cyst is a benign growth that can grow over time and sometimes get irritated or inflamed. Recommend observation if it is not bothersome. Discussed option of surgical excision to remove it if it is growing, symptomatic, or other changes noted. Please call for new or changing lesions so they can be evaluated.  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 Condition is bothersome/symptomatic for patient. Currently flared.   Advised it can take many months for affected areas to repigment.   Tinea versicolor is a chronic recurrent skin rash causing discolored scaly spots most commonly seen on back, chest, and/or shoulders.  It is generally asymptomatic. The rash is due to overgrowth of  a common type of yeast present on everyone's skin and it is not contagious.  It tends to flare more in the summer due to increased sweating on trunk.  After rash is treated, the scaliness will resolve, but the discoloration will take longer to return to normal pigmentation. The periodic use of an OTC medicated soap/shampoo with zinc or selenium sulfide can be helpful to prevent yeast overgrowth and recurrence.   Treatment:   KOH positive for numerous yeast and hyphal forms Start Ketoconazole 2% shampoo daily to wash neck, trunk, arms, until clear, then 2 times a week for maintenance, let sit 5 minutes and rinse off  ACNE VULGARIS Face, back Exam: Open comedones and inflammatory papules face and back  Chronic and persistent condition with duration or expected duration over one year. Condition is bothersome/symptomatic for patient. Currently flared.   Treatment Plan: Discussed 2nd course of Isotretinoin Isotretinoin Counseling; Review: Reviewed potential side effects of isotretinoin including xerosis, cheilitis, hepatitis, hyperlipidemia, and severe birth defects if taken by a pregnant woman.  Women on isotretinoin must be celibate (not having sex) or required to use at least 2 birth control methods to prevent pregnancy (unless patient is a male of non-child bearing potential).  Females of child-bearing potential must have monthly pregnancy tests while on isotretinoin and report through [employer] (FDA monitoring program). Reviewed reports of suicidal ideation in those with a history of depression while taking isotretinoin and reports of diagnosis of inflammatory bowl disease (IBD) while taking isotretinoin as well as the lack of evidence for a causal relationship between isotretinoin, depression  and IBD. Patient advised to reach out with any questions or concerns. Patient advised not to share pills or donate blood while on treatment or for one month after completing treatment. All patient's  considering Isotretinoin must read and understand and sign Isotretinoin Consent Form and be registered with [employer].   Patient re-registered in [facility] program [employer] ID: [id number]  Pending labs start Isotretinoin 20mg  1 po qd with fatty meal ACNE VULGARIS   Related Medications ISOtretinoin (ACCUTANE) 20 MG capsule Take 1 capsule (20 mg total) by mouth daily. Take with fatty meal TINEA VERSICOLOR   Related Medications ketoconazole (NIZORAL) 2 % shampoo Apply 1 Application topically as directed. wash neck, trunk, arms daily in shower until clear then 2 times a week for maintenance, let sit 5 minutes and rinse off EIC (EPIDERMAL INCLUSION CYST)   LONG-TERM USE OF HIGH-RISK MEDICATION   Related Procedures ALT Triglycerides COUNSELING AND COORDINATION OF CARE   MEDICATION MANAGEMENT    Return in about 5 weeks (around 03/15/2024) for Isotretinoin f/u.  I, Rollie Clipper, RMA, am acting as scribe for Harris Liming, MD .   Documentation: I have reviewed the above documentation for accuracy and completeness, and I agree with the above.  Harris Liming, MD

## 2024-02-10 ENCOUNTER — Encounter: Payer: Self-pay | Admitting: Dermatology

## 2024-03-15 ENCOUNTER — Ambulatory Visit: Admitting: Dermatology
